# Patient Record
Sex: Male | Born: 1975 | Race: White | Hispanic: No | Marital: Married | Smoking: Never smoker
Health system: Southern US, Community
[De-identification: ages and names within clinical notes are randomized; demographics above are authoritative.]

## PROBLEM LIST (undated history)

## (undated) DIAGNOSIS — C4359 Malignant melanoma of other part of trunk: Secondary | ICD-10-CM

## (undated) DIAGNOSIS — M1990 Unspecified osteoarthritis, unspecified site: Secondary | ICD-10-CM

## (undated) DIAGNOSIS — J302 Other seasonal allergic rhinitis: Secondary | ICD-10-CM

## (undated) DIAGNOSIS — Z87442 Personal history of urinary calculi: Secondary | ICD-10-CM

## (undated) HISTORY — PX: MELANOMA EXCISION: SHX5266

## (undated) HISTORY — PX: INGUINAL HERNIA REPAIR: SUR1180

## (undated) HISTORY — PX: APPENDECTOMY: SHX54

## (undated) HISTORY — PX: URETEROSCOPY: SHX842

## (undated) HISTORY — PX: WISDOM TOOTH EXTRACTION: SHX21

---

## 2006-04-01 ENCOUNTER — Emergency Department (HOSPITAL_COMMUNITY): Admission: EM | Admit: 2006-04-01 | Discharge: 2006-04-01 | Payer: Self-pay | Admitting: Emergency Medicine

## 2006-04-08 ENCOUNTER — Ambulatory Visit: Payer: Self-pay | Admitting: Internal Medicine

## 2006-06-01 ENCOUNTER — Emergency Department (HOSPITAL_COMMUNITY): Admission: EM | Admit: 2006-06-01 | Discharge: 2006-06-01 | Payer: Self-pay | Admitting: Emergency Medicine

## 2006-10-28 ENCOUNTER — Telehealth: Payer: Self-pay | Admitting: Internal Medicine

## 2006-10-28 DIAGNOSIS — K645 Perianal venous thrombosis: Secondary | ICD-10-CM | POA: Insufficient documentation

## 2006-11-11 ENCOUNTER — Encounter: Payer: Self-pay | Admitting: Internal Medicine

## 2007-02-20 ENCOUNTER — Emergency Department (HOSPITAL_COMMUNITY): Admission: EM | Admit: 2007-02-20 | Discharge: 2007-02-20 | Payer: Self-pay | Admitting: Emergency Medicine

## 2007-02-21 ENCOUNTER — Emergency Department (HOSPITAL_COMMUNITY): Admission: EM | Admit: 2007-02-21 | Discharge: 2007-02-21 | Payer: Self-pay | Admitting: Emergency Medicine

## 2007-02-28 ENCOUNTER — Ambulatory Visit (HOSPITAL_BASED_OUTPATIENT_CLINIC_OR_DEPARTMENT_OTHER): Admission: RE | Admit: 2007-02-28 | Discharge: 2007-02-28 | Payer: Self-pay | Admitting: Urology

## 2008-11-15 IMAGING — CR DG ABDOMEN 1V
2 series · 2 of 2 positions shown · non-contrast
Comparison: Abdominal CT 02/21/07.

CLINICAL DATA: Preop examination.  Right ureteral calculus. 
 ABDOMEN - 1 VIEW:

[t abdomen supine (1 of 2)]
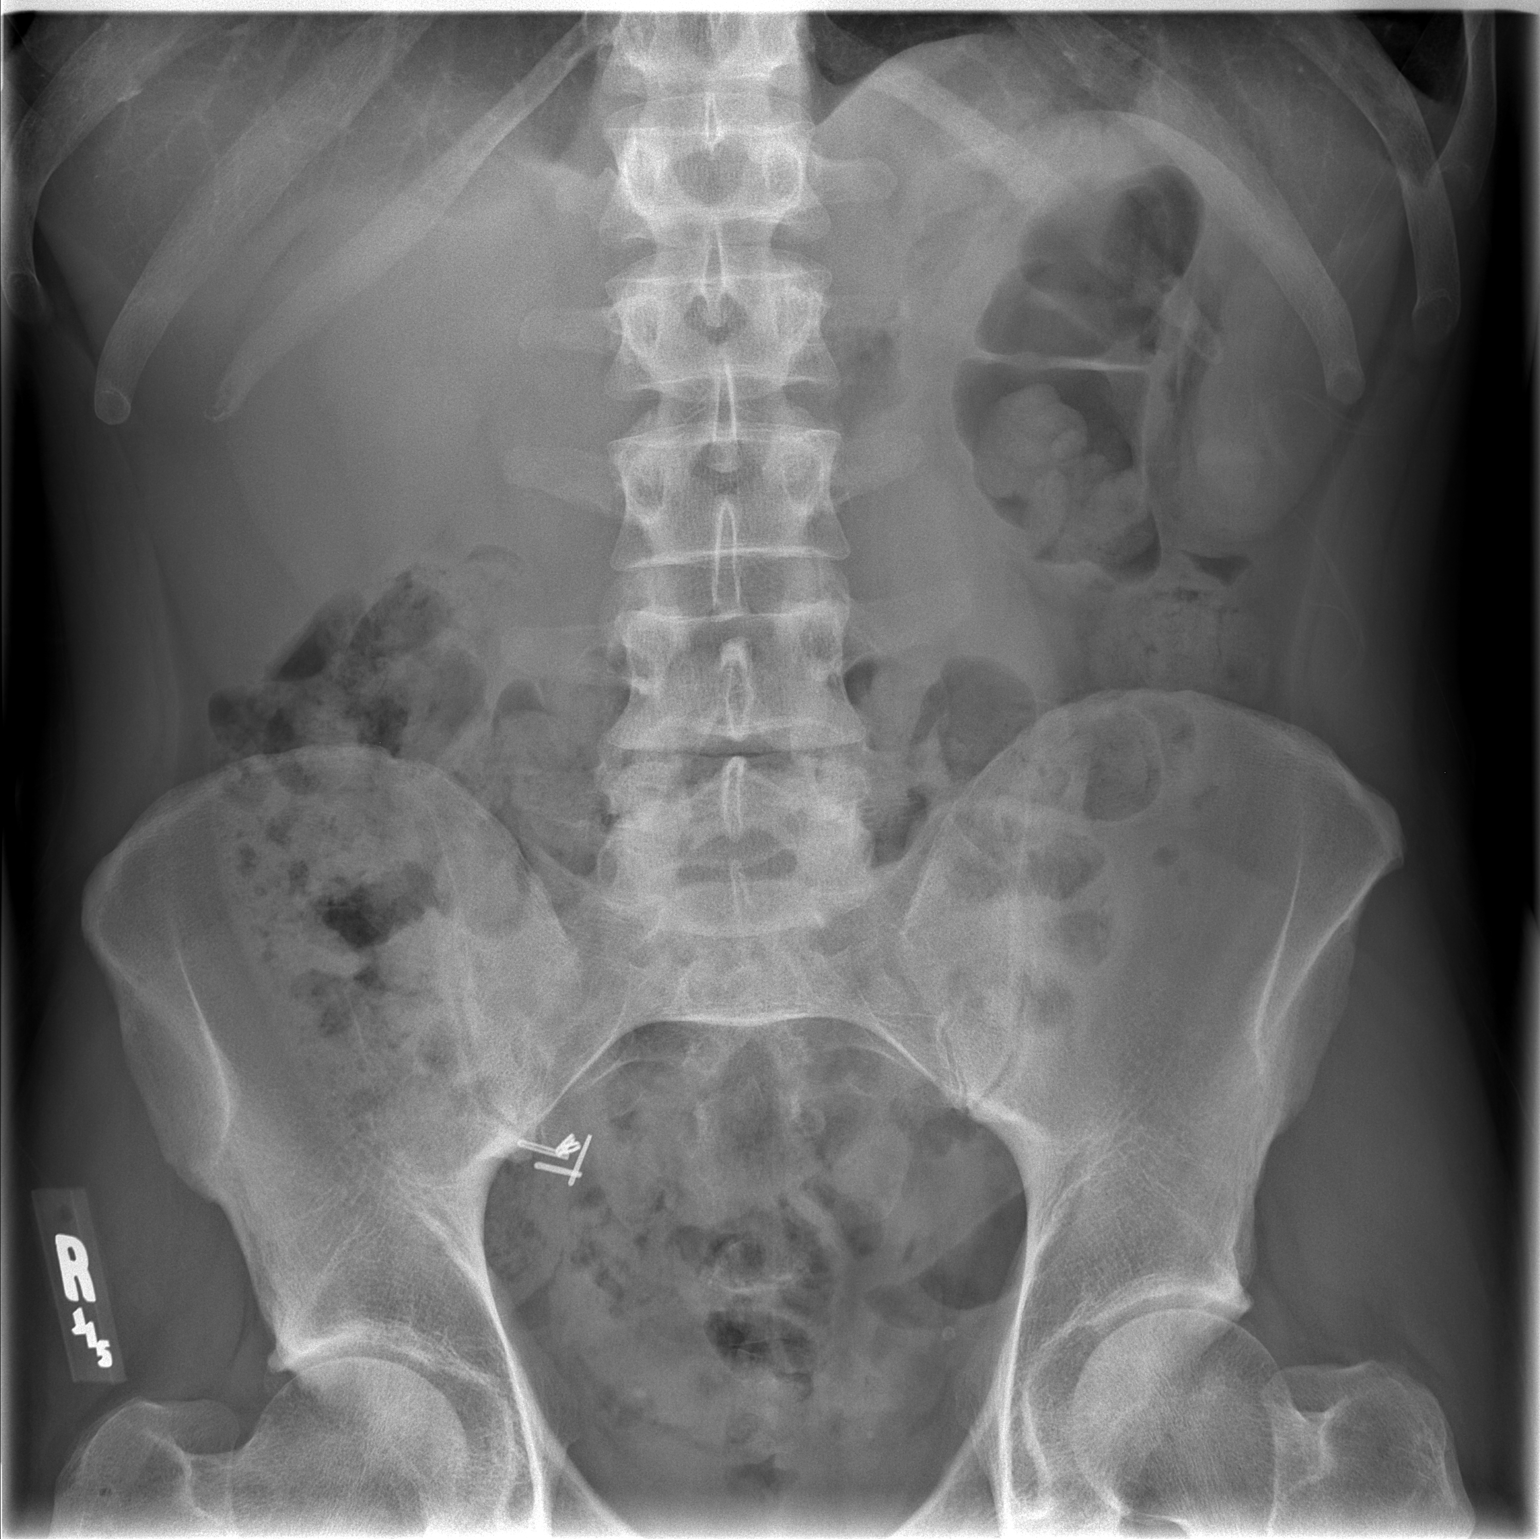

[t abdomen supine (2 of 2)]
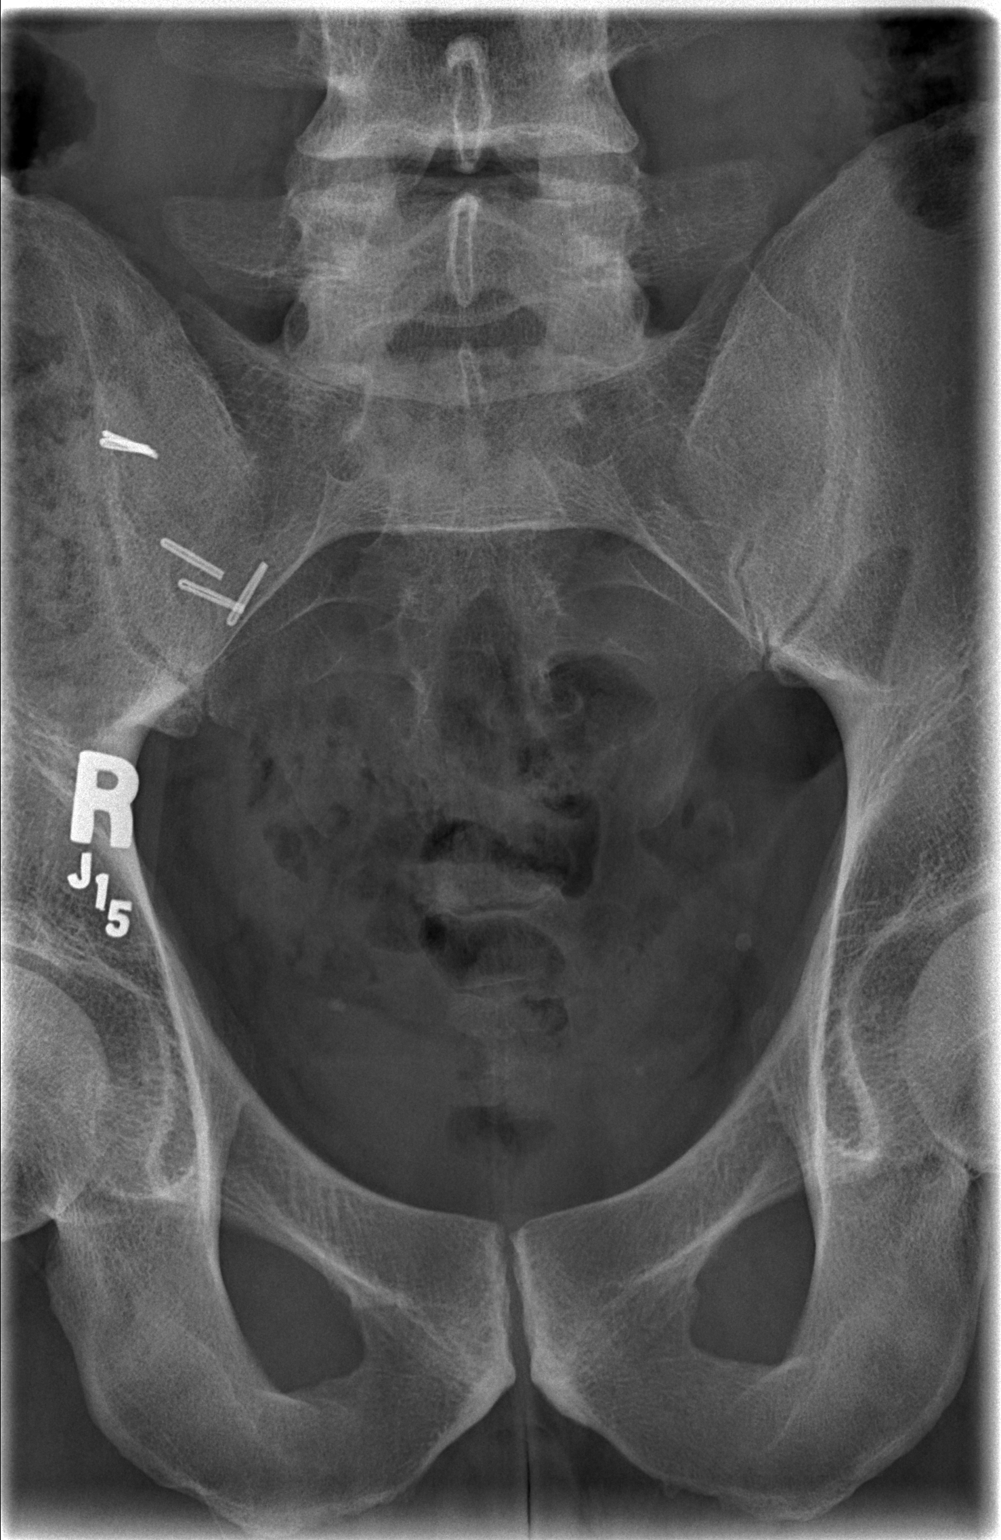

[2 of 2 positions shown; findings below may reference images not displayed]

FINDINGS: 3-4 mm right pelvic calcification is unchanged from the prior CT, corresponding with a distal ureteral calculus.  Left pelvic calcifications are also stable and correspond with phleboliths.  There are surgical clips in the right lower quadrant.  The bowel gas pattern is not obstructive with a moderate amount of stool throughout the colon.
IMPRESSION: No apparent change in the distal right ureteral calculus.

## 2010-08-08 NOTE — Op Note (Signed)
Aaron Mcclain, Aaron Mcclain               ACCOUNT NO.:  [id number]   MEDICAL RECORD NO.:  [id number]          PATIENT TYPE:  AMB   LOCATION:  [facility]                         FACILITY:  [facility]   PHYSICIAN:  Bertram Millard. Dahlstedt, M.D.DATE OF BIRTH:  08/04/1975   DATE OF PROCEDURE:  02/28/2007  DATE OF DISCHARGE:                               OPERATIVE REPORT   PREOPERATIVE DIAGNOSIS:  Right ureteral stone.   POSTOPERATIVE DIAGNOSIS:  Passed ureteral stone.   PRINCIPAL PROCEDURES:  Cystoscopy, right ureteroscopy, removal of  bladder calculi.   SURGEON:  Bertram Millard. Dahlstedt, M.D.   ANESTHESIA:  General with LMA.   COMPLICATIONS:  None.   BRIEF HISTORY:  A 35 year old male who recently presented to me with  significant right flank pain for almost a week.  He requested that we  remove this - this is scheduled for today.  He has had need for  significant pain medicine, and has not been able to work his usual  schedule.  He has had no fever or chills.  He was diagnosed with his  right ureteral stone approximately a week ago.  Alternatives to  extraction of the stone were discussed with the patient as well as risks  and complications involved.  He understands these and desires to  proceed.   DESCRIPTION OF PROCEDURE:  The patient was administered preoperative IV  antibiotics.  He was taken to the operating room where general  anesthetic was administered.  He was placed in the dorsal lithotomy  position.  Genitalia and perineum were prepped and draped.  A 6 French  short ureteroscope was advanced through his urethra into his right  distal ureter.  No stones were seen.  There was some definite narrowing  of this, and I was unable to pass the scope.  I then passed a guidewire  through the scope up into the right renal pelvis.  I then removed the  scope and dilated the distal ureter with a ureteral access sheath.  The  sheath was then removed.  I then repassed the scope.  There was no stone  seen  - I visualized the ureter all the way up to the mid ureter.  At  this point I backed the scope out again.  Again no stone was seen.  I  then looked in the bladder with the scope.  Several small calculi were  seen layering posteriorly in the bladder.  The largest of these,  approximately 3 mm in length, was then removed.  The remaining fragments  were felt small enough to be easily passed.  At this point the bladder  was drained, the guidewire removed and the procedure terminated.  He had  been administered a B & O suppository.  He was awakened and taken to  PACU in stable condition.  He will follow-up in approximately 1 week.  He was discharged on Vicodin p.r.n. and Bactrim DS one p.o. b.i.d. for 3  days.      Bertram Millard. Dahlstedt, M.D.  Electronically Signed    SMD/MEDQ  D:  02/28/2007  T:  02/28/2007  Job:  [id number]

## 2010-08-11 NOTE — Assessment & Plan Note (Signed)
[facility] OFFICE NOTE   WESSON, STITH                        MRN:          [id number]  DATE:04/08/2006                            DOB:          [birth year]    A 35 year old gentleman who was seen today to establish with our  practice.  He has enjoyed excellent health.  He has relocated back to  the [place] from [place] but did go to high school and college in the  area.  He had a remote appendectomy in 1995 and at six months had a  hernia repair.  He was seen at the Urgent Care recently for symptomatic  hemorrhoids.  He is improving.   PAST MEDICAL HISTORY:  Otherwise unremarkable.   FAMILY HISTORY:  Father in his late 67 apparently has history of  diabetes and coronary artery disease status post MI.  Mother, 62, in  good health.  One sister as well.   PHYSICAL EXAMINATION:  GENERAL: Well-developed, fit male in no acute  distress.  VITAL SIGNS: Blood pressure low-normal.  SKIN: Unremarkable.  HEAD, EYES, EARS, NOSE, AND THROAT:  Clear.  NECK:  No adenopathy or thyroid enlargement.  CHEST: Clear.  CARDIOVASCULAR:  Normal heart sounds, no murmurs.  ABDOMEN: Benign.  He has a right lower quadrant surgical scar.  GU:  External genitalia normal.  RECTAL: Exam revealed no external disease.  He did have internal  hemorrhoids that were slightly tender. Stool heme negative.  EXTREMITIES:  Full peripheral pulses, no edema.   IMPRESSION:  Symptomatic internal hemorrhoids.   DISPOSITION:  He is responding to local treatment.  Depending on his  clinical status, will consider referral to a general surgeon.     Gordy Savers, MD  Electronically Signed    PFK/MedQ  DD: 04/08/2006  DT: 04/08/2006  Job #: [id number]

## 2011-01-01 LAB — POCT HEMOGLOBIN-HEMACUE
Hemoglobin: 14.8
Operator id: 268271

## 2011-01-02 LAB — POCT URINALYSIS DIP (DEVICE)
Glucose, UA: NEGATIVE
Ketones, ur: NEGATIVE
Operator id: 126491
Protein, ur: NEGATIVE
Specific Gravity, Urine: <=1.005

## 2011-01-02 LAB — URINALYSIS, ROUTINE W REFLEX MICROSCOPIC
Bilirubin Urine: NEGATIVE
Glucose, UA: NEGATIVE
Urobilinogen, UA: 0.2

## 2018-12-23 ENCOUNTER — Other Ambulatory Visit: Payer: Self-pay

## 2018-12-23 DIAGNOSIS — Z20822 Contact with and (suspected) exposure to covid-19: Secondary | ICD-10-CM

## 2018-12-25 ENCOUNTER — Ambulatory Visit: Payer: Self-pay

## 2018-12-25 LAB — NOVEL CORONAVIRUS, NAA: SARS-CoV-2, NAA: DETECTED — AB

## 2018-12-25 NOTE — Telephone Encounter (Signed)
Incoming call from Patient requesting Covid testing result Provided results voiced understanding.  Provided care advice.

## 2019-04-17 ENCOUNTER — Ambulatory Visit
Admission: EM | Admit: 2019-04-17 | Discharge: 2019-04-17 | Disposition: A | Discharge status: Home / Self Care | Payer: BC Managed Care – PPO | Attending: Emergency Medicine | Admitting: Emergency Medicine

## 2019-04-17 ENCOUNTER — Other Ambulatory Visit: Payer: Self-pay

## 2019-04-17 ENCOUNTER — Encounter: Payer: Self-pay | Admitting: Emergency Medicine

## 2019-04-17 DIAGNOSIS — Z20822 Contact with and (suspected) exposure to covid-19: Secondary | ICD-10-CM | POA: Diagnosis not present

## 2019-04-17 DIAGNOSIS — J069 Acute upper respiratory infection, unspecified: Secondary | ICD-10-CM

## 2019-04-17 NOTE — ED Triage Notes (Signed)
Pt presents to UC w/ c/o chest and nasal congestion, headache, difficulty taking deep breath x3 days. Pt's daughter is covid positive.

## 2019-04-17 NOTE — Discharge Instructions (Addendum)
COVID testing ordered.  It will take between 2-7 days for test results.  Someone will contact you regarding abnormal results.    In the meantime: You should remain isolated in your home for 10 days from symptom onset AND greater than 72 hours after symptoms resolution (absence of fever without the use of fever-reducing medication and improvement in respiratory symptoms), whichever is longer Get plenty of rest and push fluids Use OTC medications like ibuprofen or tylenol as needed fever or pain Follow up with PCP regarding results Call or go to the ED if you have any new or worsening symptoms such as fever, cough, shortness of breath, chest tightness, chest pain, turning blue, changes in mental status, etc..Marland Kitchen

## 2019-04-17 NOTE — ED Provider Notes (Signed)
[place]   [id number] 04/17/19 Arrival Time: 1619   CC: COVID symptoms  SUBJECTIVE: History from: patient.  Aaron Mcclain is a 44 y.o. male who presents with sinus headache, nasal congestion, PND, and mild chest pressure with deep breath x 3 days. Daughters tested positive for COVID.  Denies recent travel.  Takes allegra daily, but has not taken anything specifically for presenting symptoms. Symptoms are made worse with working out, states he feels deconditioned.  Reports previous COVID infection in September of 2020.   Denies fever, chills, fatigue, rhinorrhea, sore throat, SOB, wheezing, chest pain, nausea, vomiting, changes in bowel or bladder habits.     ROS: As per HPI.  All other pertinent ROS negative.     History reviewed. No pertinent past medical history. History reviewed. No pertinent surgical history. No Known Allergies No current facility-administered medications on file prior to encounter.   No current outpatient medications on file prior to encounter.   Social History   Socioeconomic History  . Marital status: Married    Spouse name: Not on file  . Number of children: Not on file  . Years of education: Not on file  . Highest education level: Not on file  Occupational History  . Not on file  Tobacco Use  . Smoking status: Never Smoker  . Smokeless tobacco: Never Used  Substance and Sexual Activity  . Alcohol use: Yes    Comment: occasional  . Drug use: Never  . Sexual activity: Not on file  Other Topics Concern  . Not on file  Social History Narrative  . Not on file   Social Determinants of Health   Financial Resource Strain:   . Difficulty of Paying Living Expenses: Not on file  Food Insecurity:   . Worried About Charity fundraiser in the Last Year: Not on file  . Ran Out of Food in the Last Year: Not on file  Transportation Needs:   . Lack of Transportation (Medical): Not on file  . Lack of Transportation (Non-Medical): Not on file   Physical Activity:   . Days of Exercise per Week: Not on file  . Minutes of Exercise per Session: Not on file  Stress:   . Feeling of Stress : Not on file  Social Connections:   . Frequency of Communication with Friends and Family: Not on file  . Frequency of Social Gatherings with Friends and Family: Not on file  . Attends Religious Services: Not on file  . Active Member of Clubs or Organizations: Not on file  . Attends Archivist Meetings: Not on file  . Marital Status: Not on file  Intimate Partner Violence:   . Fear of Current or Ex-Partner: Not on file  . Emotionally Abused: Not on file  . Physically Abused: Not on file  . Sexually Abused: Not on file   Family History  Problem Relation Age of Onset  . Healthy Mother   . Healthy Father     OBJECTIVE:  Vitals:   04/17/19 1636  BP: 140/80  Pulse: 68  Resp: 17  Temp: 98.2 F (36.8 C)  TempSrc: Oral  SpO2: 98%     General appearance: alert; appears mildly fatigued, but nontoxic; speaking in full sentences and tolerating own secretions HEENT: NCAT; Ears: EACs clear, TMs pearly gray; Eyes: PERRL.  EOM grossly intact. Nose: nares patent without rhinorrhea, Throat: oropharynx clear, tonsils non erythematous or enlarged, uvula midline  Neck: supple without LAD Lungs: unlabored respirations, symmetrical air  entry; cough: absent; no respiratory distress; CTAB Heart: regular rate and rhythm.   Skin: warm and dry Psychological: alert and cooperative; normal mood and affect  ASSESSMENT & PLAN:  1. Suspected COVID-19 virus infection   2. Viral URI    COVID testing ordered.  It will take between 2-7 days for test results.  Someone will contact you regarding abnormal results.    In the meantime: You should remain isolated in your home for 10 days from symptom onset AND greater than 72 hours after symptoms resolution (absence of fever without the use of fever-reducing medication and improvement in respiratory  symptoms), whichever is longer Get plenty of rest and push fluids Use OTC medications like ibuprofen or tylenol as needed fever or pain Follow up with PCP regarding results Call or go to the ED if you have any new or worsening symptoms such as fever, cough, shortness of breath, chest tightness, chest pain, turning blue, changes in mental status, etc...    Reviewed expectations re: course of current medical issues. Questions answered. Outlined signs and symptoms indicating need for more acute intervention. Patient verbalized understanding. After Visit Summary given.         Lestine Box, PA-C 04/17/19 1704

## 2019-04-18 LAB — SPECIMEN STATUS REPORT

## 2019-04-18 LAB — NOVEL CORONAVIRUS, NAA: SARS-CoV-2, NAA: NOT DETECTED

## 2019-12-14 ENCOUNTER — Ambulatory Visit: Payer: Self-pay | Admitting: Surgery

## 2019-12-14 NOTE — Patient Instructions (Addendum)
DUE TO COVID-19 ONLY ONE VISITOR IS ALLOWED TO COME WITH YOU AND STAY IN THE WAITING ROOM ONLY  DURING PRE OP AND PROCEDURE.   IF YOU WILL BE ADMITTED INTO THE HOSPITAL YOU ARE ALLOWED ONE SUPPORT PERSON DURING  VISITATION HOURS ONLY (10AM -8PM)   . The support person may change daily. . The support person must pass our screening, gel in and out, and wear a mask at all times, including in the patient's room. . Patients must also wear a mask when staff or their support person are in the room.   COVID SWAB TESTING MUST BE COMPLETED ON:  Friday, 12-18-19 @ 9:35 AM    [address] [place], [place] [address]  (Must self quarantine after testing. Follow instructions on handout.)        Your procedure is scheduled on:  Tuesday, 12-22-19   Report to [facility] Main  Entrance   Report to admitting at 10:45 AM   Call this number if you have problems the morning of surgery [phone]   Do not eat food :After Midnight.   May have liquids until  9:45 AM  day of surgery  CLEAR LIQUID DIET  Foods Allowed                                                                     Foods Excluded  Water, Black Coffee and tea, regular and decaf             liquids that you cannot  Plain Jell-O in any flavor  (No red)                                   see through such as: Fruit ices (not with fruit pulp)                                      milk, soups, orange juice              Iced Popsicles (No red)                                      All solid food                                   Apple juices Sports drinks like Gatorade (No red) Lightly seasoned clear broth or consume(fat free)  Sugar, honey syrup     Oral Hygiene is also important to reduce your risk of infection.                                    Remember - BRUSH YOUR TEETH THE MORNING OF SURGERY WITH YOUR REGULAR TOOTHPASTE   Do NOT smoke after Midnight   Take these medicines the morning of surgery with A SIP OF WATER: None  You may not have any metal on your body including jewelry, and body piercings             Do not wear lotions, powders, perfumes/cologne, or deodorant             Men may shave face and neck.   Do not bring valuables to the hospital. [place].   Contacts, dentures or bridgework may not be worn into surgery.   Bring small overnight bag day of surgery.    Patients discharged the day of surgery will not be allowed to drive home.                Please read over the following fact sheets you were given: IF YOU HAVE QUESTIONS ABOUT YOUR PRE OP INSTRUCTIONS PLEASE CALL [phone]   [facility] - Preparing for Surgery Before surgery, you can play an important role.  Because skin is not sterile, your skin needs to be as free of germs as possible.  You can reduce the number of germs on your skin by washing with CHG (chlorahexidine gluconate) soap before surgery.  CHG is an antiseptic cleaner which kills germs and bonds with the skin to continue killing germs even after washing. Please DO NOT use if you have an allergy to CHG or antibacterial soaps.  If your skin becomes reddened/irritated stop using the CHG and inform your nurse when you arrive at Short Stay. Do not shave (including legs and underarms) for at least 48 hours prior to the first CHG shower.  You may shave your face/neck.  Please follow these instructions carefully:  1.  Shower with CHG Soap the night before surgery and the  morning of surgery.  2.  If you choose to wash your hair, wash your hair first as usual with your normal  shampoo.  3.  After you shampoo, rinse your hair and body thoroughly to remove the shampoo.                             4.  Use CHG as you would any other liquid soap.  You can apply chg directly to the skin and wash.  Gently with a scrungie or clean washcloth.  5.  Apply the CHG Soap to your body ONLY FROM THE NECK DOWN.   Do   not use on  face/ open                           Wound or open sores. Avoid contact with eyes, ears mouth and   genitals (private parts).                       Wash face,  Genitals (private parts) with your normal soap.             6.  Wash thoroughly, paying special attention to the area where your    surgery  will be performed.  7.  Thoroughly rinse your body with warm water from the neck down.  8.  DO NOT shower/wash with your normal soap after using and rinsing off the CHG Soap.                9.  Pat yourself dry with a clean towel.            10.  Wear clean pajamas.  11.  Place clean sheets on your bed the night of your first shower and do not  sleep with pets. Day of Surgery : Do not apply any lotions/deodorants the morning of surgery.  Please wear clean clothes to the hospital/surgery center.  FAILURE TO FOLLOW THESE INSTRUCTIONS MAY RESULT IN THE CANCELLATION OF YOUR SURGERY  PATIENT SIGNATURE_________________________________  NURSE SIGNATURE__________________________________  ________________________________________________________________________

## 2019-12-14 NOTE — Progress Notes (Addendum)
COVID Vaccine Completed: x1 Date COVID Vaccine completed: 07-06-19 COVID vaccine manufacturer: Kelly   PCP - Anastasia Pall, MD Cardiologist -   Chest x-ray -  EKG -  Stress Test -  ECHO -  Cardiac Cath -  Pacemaker/ICD device last checked:  Sleep Study -  CPAP -   Fasting Blood Sugar -  Checks Blood Sugar _____ times a day  Blood Thinner Instructions: Aspirin Instructions: Last Dose:  Anesthesia review:   Patient denies shortness of breath, fever, cough and chest pain at PAT appointment   Patient verbalized understanding of instructions that were given to them at the PAT appointment. Patient was also instructed that they will need to review over the PAT instructions again at home before surgery.

## 2019-12-14 NOTE — H&P (Signed)
Aaron Mcclain DOB: [birth year] Married / Language: English / Race: White Male   History of Present Illness Patient words: Healthy and very active 44 year old man who presented in June with a left inguinal hernia. He noticed this about a month and a half ago. It is currently asymptomatic, aside from brief pain whenever he sneezes or vasalvas significantly. He has been taking it easy with his workouts and has cut back on leg work and squats etc. He reports a history of right inguinal hernia repair when he was about 59-months-old, but no other abdominal surgery. 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 Nonsmoker.   Allergies  No Known Drug Allergies    Medication History  No Current Medications Medications Reconciled  Other Problems Arthritis     Review of Systems Musculoskeletal Present- Back Pain. Not Present- Joint Pain, Joint Stiffness, Muscle Pain, Muscle Weakness and Swelling of Extremities. Neurological Not Present- Decreased Memory, Fainting, Headaches, Numbness, Seizures, Tingling, Tremor, Trouble walking and Weakness. All other systems negative  Vitals  Weight: 211.25 lb Height: 78in Body Surface Area: 2.31 m Body Mass Index: 24.41 kg/m  Temp.: 97.11F  Pulse: 77 (Regular)  BP: 120/76(Sitting, Left Arm, Standard)  Physical Exam  Alert and well-appearing Unlabored respirations Abdomen is soft and nontender. There is a reducible left inguinal hernia.    Assessment & Plan LEFT INGUINAL HERNIA (K40.90) Story: We discussed the relevant anatomy and pathology. Discussed options for repair including open and laparoscopic/robotic. Discussed the techniques as well as risks of bleeding, infection, pain, scarring, injury to intra-abdominal or intracranial structures including bowel, bladder, vessels, nerves, chronic pain, hernia recurrence, and systemic risk from general anesthesia. Questions were welcomed and  answered. Given his activity level I think a laparoscopic approach would have a slight advantage, but did discuss that typically for unilateral non-recurrent hernia an open approach is warranted. He would like to consider surgery and he will call when he is ready to schedule

## 2019-12-14 NOTE — H&P (View-Only) (Signed)
Aaron Mcclain DOB: Jun 14, 1975 Married / Language: English / Race: White Male   History of Present Illness Patient words: Healthy and very active 44 year old man who presented in June with a left inguinal hernia. He noticed this about a month and a half ago. It is currently asymptomatic, aside from brief pain whenever he sneezes or vasalvas significantly. He has been taking it easy with his workouts and has cut back on leg work and squats etc. He reports a history of right inguinal hernia repair when he was about 97-months-old, but no other abdominal surgery. 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 Nonsmoker.   Allergies  No Known Drug Allergies    Medication History  No Current Medications Medications Reconciled  Other Problems Arthritis     Review of Systems Musculoskeletal Present- Back Pain. Not Present- Joint Pain, Joint Stiffness, Muscle Pain, Muscle Weakness and Swelling of Extremities. Neurological Not Present- Decreased Memory, Fainting, Headaches, Numbness, Seizures, Tingling, Tremor, Trouble walking and Weakness. All other systems negative  Vitals  Weight: 211.25 lb Height: 78in Body Surface Area: 2.31 m Body Mass Index: 24.41 kg/m  Temp.: 97.67F  Pulse: 77 (Regular)  BP: 120/76(Sitting, Left Arm, Standard)  Physical Exam  Alert and well-appearing Unlabored respirations Abdomen is soft and nontender. There is a reducible left inguinal hernia.    Assessment & Plan LEFT INGUINAL HERNIA (K40.90) Story: We discussed the relevant anatomy and pathology. Discussed options for repair including open and laparoscopic/robotic. Discussed the techniques as well as risks of bleeding, infection, pain, scarring, injury to intra-abdominal or intracranial structures including bowel, bladder, vessels, nerves, chronic pain, hernia recurrence, and systemic risk from general anesthesia. Questions were welcomed and  answered. Given his activity level I think a laparoscopic approach would have a slight advantage, but did discuss that typically for unilateral non-recurrent hernia an open approach is warranted. He would like to consider surgery and he will call when he is ready to schedule

## 2019-12-16 ENCOUNTER — Encounter (HOSPITAL_COMMUNITY)
Admission: RE | Admit: 2019-12-16 | Discharge: 2019-12-16 | Disposition: A | Discharge status: Home / Self Care | Payer: BC Managed Care – PPO | Source: Ambulatory Visit | Attending: Surgery | Admitting: Surgery

## 2019-12-16 ENCOUNTER — Other Ambulatory Visit: Payer: Self-pay

## 2019-12-16 ENCOUNTER — Encounter (HOSPITAL_COMMUNITY): Payer: Self-pay

## 2019-12-16 DIAGNOSIS — Z01812 Encounter for preprocedural laboratory examination: Secondary | ICD-10-CM | POA: Diagnosis not present

## 2019-12-16 HISTORY — DX: Unspecified osteoarthritis, unspecified site: M19.90

## 2019-12-16 HISTORY — DX: Other seasonal allergic rhinitis: J30.2

## 2019-12-16 HISTORY — DX: Personal history of urinary calculi: Z87.442

## 2019-12-16 HISTORY — DX: Malignant melanoma of other part of trunk: C43.59

## 2019-12-16 LAB — CBC
HCT: 41.1 % (ref 39.0–52.0)
Hemoglobin: 13.8 g/dL (ref 13.0–17.0)
MCH: 31.9 pg (ref 26.0–34.0)
MCHC: 33.6 g/dL (ref 30.0–36.0)
MCV: 94.9 fL (ref 80.0–100.0)
Platelets: 194 10*3/uL (ref 150–400)
RBC: 4.33 MIL/uL (ref 4.22–5.81)
RDW: 11.3 % — ABNORMAL LOW (ref 11.5–15.5)
WBC: 7.3 10*3/uL (ref 4.0–10.5)
nRBC: 0 % (ref 0.0–0.2)

## 2019-12-18 ENCOUNTER — Other Ambulatory Visit (HOSPITAL_COMMUNITY)
Admission: RE | Admit: 2019-12-18 | Discharge: 2019-12-18 | Disposition: A | Discharge status: Home / Self Care | Payer: BC Managed Care – PPO | Source: Ambulatory Visit | Attending: Surgery | Admitting: Surgery

## 2019-12-18 DIAGNOSIS — Z01812 Encounter for preprocedural laboratory examination: Secondary | ICD-10-CM | POA: Diagnosis present

## 2019-12-18 DIAGNOSIS — Z20822 Contact with and (suspected) exposure to covid-19: Secondary | ICD-10-CM | POA: Insufficient documentation

## 2019-12-18 LAB — SARS CORONAVIRUS 2 (TAT 6-24 HRS): SARS Coronavirus 2: NEGATIVE

## 2019-12-21 ENCOUNTER — Encounter (HOSPITAL_COMMUNITY): Payer: Self-pay | Admitting: Surgery

## 2019-12-21 MED ORDER — BUPIVACAINE LIPOSOME 1.3 % IJ SUSP
20.0000 mL | Freq: Once | INTRAMUSCULAR | Status: DC
  Filled 2019-12-21: qty 20

## 2019-12-22 ENCOUNTER — Other Ambulatory Visit: Payer: Self-pay

## 2019-12-22 ENCOUNTER — Ambulatory Visit (HOSPITAL_COMMUNITY)
Admission: RE | Admit: 2019-12-22 | Discharge: 2019-12-22 | Disposition: A | Discharge status: Home / Self Care | Payer: BC Managed Care – PPO | Attending: Surgery | Admitting: Surgery

## 2019-12-22 ENCOUNTER — Ambulatory Visit (HOSPITAL_COMMUNITY): Payer: BC Managed Care – PPO | Admitting: Anesthesiology

## 2019-12-22 ENCOUNTER — Encounter (HOSPITAL_COMMUNITY)
Admission: RE | Disposition: A | Discharge status: Home / Self Care | Payer: Self-pay | Source: Home / Self Care | Attending: Surgery

## 2019-12-22 ENCOUNTER — Encounter (HOSPITAL_COMMUNITY): Payer: Self-pay | Admitting: Surgery

## 2019-12-22 DIAGNOSIS — M1990 Unspecified osteoarthritis, unspecified site: Secondary | ICD-10-CM | POA: Insufficient documentation

## 2019-12-22 DIAGNOSIS — K4090 Unilateral inguinal hernia, without obstruction or gangrene, not specified as recurrent: Secondary | ICD-10-CM | POA: Diagnosis present

## 2019-12-22 HISTORY — PX: INGUINAL HERNIA REPAIR: SHX194

## 2019-12-22 SURGERY — REPAIR, HERNIA, INGUINAL, ADULT
Anesthesia: General | Site: Abdomen | Laterality: Left

## 2019-12-22 MED ORDER — CEFAZOLIN SODIUM-DEXTROSE 2-4 GM/100ML-% IV SOLN
2.0000 g | INTRAVENOUS | Status: AC
  Administered 2019-12-22: 2 g via INTRAVENOUS
  Filled 2019-12-22: qty 100

## 2019-12-22 MED ORDER — FENTANYL CITRATE (PF) 100 MCG/2ML IJ SOLN: 25.0000 ug | INTRAMUSCULAR | Status: DC | PRN

## 2019-12-22 MED ORDER — PROPOFOL 500 MG/50ML IV EMUL
INTRAVENOUS | Status: AC
  Filled 2019-12-22: qty 50

## 2019-12-22 MED ORDER — SODIUM CHLORIDE 0.9% FLUSH: 3.0000 mL | Freq: Two times a day (BID) | INTRAVENOUS | Status: DC

## 2019-12-22 MED ORDER — MIDAZOLAM HCL 2 MG/2ML IJ SOLN
INTRAMUSCULAR | Status: AC
  Filled 2019-12-22: qty 2

## 2019-12-22 MED ORDER — BUPIVACAINE-EPINEPHRINE 0.25% -1:200000 IJ SOLN
INTRAMUSCULAR | Status: DC | PRN
  Administered 2019-12-22: 30 mL

## 2019-12-22 MED ORDER — GABAPENTIN 300 MG PO CAPS
300.0000 mg | ORAL_CAPSULE | ORAL | Status: AC
  Administered 2019-12-22: 300 mg via ORAL
  Filled 2019-12-22: qty 1

## 2019-12-22 MED ORDER — ONDANSETRON HCL 4 MG/2ML IJ SOLN
INTRAMUSCULAR | Status: AC
  Filled 2019-12-22: qty 2

## 2019-12-22 MED ORDER — DEXAMETHASONE SODIUM PHOSPHATE 4 MG/ML IJ SOLN
INTRAMUSCULAR | Status: DC | PRN
  Administered 2019-12-22: 9 mg via INTRAVENOUS

## 2019-12-22 MED ORDER — BUPIVACAINE LIPOSOME 1.3 % IJ SUSP
INTRAMUSCULAR | Status: DC | PRN
  Administered 2019-12-22: 20 mL

## 2019-12-22 MED ORDER — CHLORHEXIDINE GLUCONATE 4 % EX LIQD: 60.0000 mL | Freq: Once | CUTANEOUS | Status: DC

## 2019-12-22 MED ORDER — ONDANSETRON 4 MG PO TBDP: 4.0000 mg | ORAL_TABLET | Freq: Three times a day (TID) | ORAL | 0 refills | Status: AC | PRN

## 2019-12-22 MED ORDER — ACETAMINOPHEN 500 MG PO TABS
1000.0000 mg | ORAL_TABLET | ORAL | Status: AC
  Administered 2019-12-22: 1000 mg via ORAL
  Filled 2019-12-22: qty 2

## 2019-12-22 MED ORDER — LIDOCAINE HCL (CARDIAC) PF 100 MG/5ML IV SOSY
PREFILLED_SYRINGE | INTRAVENOUS | Status: DC | PRN
  Administered 2019-12-22: 50 mg via INTRAVENOUS

## 2019-12-22 MED ORDER — ORAL CARE MOUTH RINSE: 15.0000 mL | Freq: Once | OROMUCOSAL | Status: AC

## 2019-12-22 MED ORDER — EPHEDRINE SULFATE-NACL 50-0.9 MG/10ML-% IV SOSY
PREFILLED_SYRINGE | INTRAVENOUS | Status: DC | PRN
  Administered 2019-12-22: 10 mg via INTRAVENOUS

## 2019-12-22 MED ORDER — PROPOFOL 10 MG/ML IV BOLUS
INTRAVENOUS | Status: DC | PRN
  Administered 2019-12-22 (×2): 200 mg via INTRAVENOUS

## 2019-12-22 MED ORDER — SODIUM CHLORIDE 0.9 % IV SOLN: 250.0000 mL | INTRAVENOUS | Status: DC | PRN

## 2019-12-22 MED ORDER — OXYCODONE HCL 5 MG PO TABS: 5.0000 mg | ORAL_TABLET | Freq: Three times a day (TID) | ORAL | 0 refills | Status: AC | PRN

## 2019-12-22 MED ORDER — LACTATED RINGERS IV SOLN: INTRAVENOUS | Status: DC

## 2019-12-22 MED ORDER — AMISULPRIDE (ANTIEMETIC) 5 MG/2ML IV SOLN
INTRAVENOUS | Status: AC
  Filled 2019-12-22: qty 2

## 2019-12-22 MED ORDER — AMISULPRIDE (ANTIEMETIC) 5 MG/2ML IV SOLN
10.0000 mg | Freq: Once | INTRAVENOUS | Status: AC
  Administered 2019-12-22: 10 mg via INTRAVENOUS

## 2019-12-22 MED ORDER — ONDANSETRON HCL 4 MG/2ML IJ SOLN
4.0000 mg | Freq: Once | INTRAMUSCULAR | Status: AC
  Administered 2019-12-22: 4 mg via INTRAVENOUS

## 2019-12-22 MED ORDER — KETOROLAC TROMETHAMINE 30 MG/ML IJ SOLN
30.0000 mg | Freq: Once | INTRAMUSCULAR | Status: AC | PRN
  Administered 2019-12-22: 30 mg via INTRAVENOUS

## 2019-12-22 MED ORDER — ACETAMINOPHEN 650 MG RE SUPP
650.0000 mg | RECTAL | Status: DC | PRN
  Filled 2019-12-22: qty 1

## 2019-12-22 MED ORDER — FENTANYL CITRATE (PF) 250 MCG/5ML IJ SOLN
INTRAMUSCULAR | Status: DC | PRN
  Administered 2019-12-22: 100 ug via INTRAVENOUS
  Administered 2019-12-22: 50 ug via INTRAVENOUS

## 2019-12-22 MED ORDER — KETAMINE HCL 10 MG/ML IJ SOLN
INTRAMUSCULAR | Status: DC | PRN
  Administered 2019-12-22: 30 mg via INTRAVENOUS

## 2019-12-22 MED ORDER — FENTANYL CITRATE (PF) 250 MCG/5ML IJ SOLN
INTRAMUSCULAR | Status: AC
Start: 2019-12-22 — End: ?
  Filled 2019-12-22: qty 5

## 2019-12-22 MED ORDER — MIDAZOLAM HCL 5 MG/5ML IJ SOLN
INTRAMUSCULAR | Status: DC | PRN
  Administered 2019-12-22: 2 mg via INTRAVENOUS

## 2019-12-22 MED ORDER — BUPIVACAINE-EPINEPHRINE (PF) 0.25% -1:200000 IJ SOLN
INTRAMUSCULAR | Status: AC
  Filled 2019-12-22: qty 30

## 2019-12-22 MED ORDER — OXYCODONE HCL 5 MG PO TABS: 5.0000 mg | ORAL_TABLET | ORAL | Status: DC | PRN

## 2019-12-22 MED ORDER — ONDANSETRON HCL 4 MG/2ML IJ SOLN
INTRAMUSCULAR | Status: DC | PRN
  Administered 2019-12-22: 4 mg via INTRAVENOUS

## 2019-12-22 MED ORDER — ACETAMINOPHEN 325 MG PO TABS: 650.0000 mg | ORAL_TABLET | ORAL | Status: DC | PRN

## 2019-12-22 MED ORDER — SODIUM CHLORIDE 0.9% FLUSH: 3.0000 mL | INTRAVENOUS | Status: DC | PRN

## 2019-12-22 MED ORDER — 0.9 % SODIUM CHLORIDE (POUR BTL) OPTIME
TOPICAL | Status: DC | PRN
  Administered 2019-12-22: 1000 mL

## 2019-12-22 MED ORDER — KETOROLAC TROMETHAMINE 30 MG/ML IJ SOLN
INTRAMUSCULAR | Status: AC
  Filled 2019-12-22: qty 1

## 2019-12-22 MED ORDER — CHLORHEXIDINE GLUCONATE 0.12 % MT SOLN
15.0000 mL | Freq: Once | OROMUCOSAL | Status: AC
  Administered 2019-12-22: 15 mL via OROMUCOSAL

## 2019-12-22 MED ORDER — DOCUSATE SODIUM 100 MG PO CAPS: 100.0000 mg | ORAL_CAPSULE | Freq: Two times a day (BID) | ORAL | 0 refills | Status: AC

## 2019-12-22 MED ORDER — CHLORHEXIDINE GLUCONATE 4 % EX LIQD
60.0000 mL | Freq: Once | CUTANEOUS | Status: AC
  Administered 2019-12-22: 4 via TOPICAL

## 2019-12-22 SURGICAL SUPPLY — 42 items
APL PRP STRL LF DISP 70% ISPRP (MISCELLANEOUS) ×1
APL SKNCLS STERI-STRIP NONHPOA (GAUZE/BANDAGES/DRESSINGS) ×1
BENZOIN TINCTURE PRP APPL 2/3 (GAUZE/BANDAGES/DRESSINGS) ×3 IMPLANT
BLADE SURG 15 STRL LF DISP TIS (BLADE) ×1 IMPLANT
BLADE SURG 15 STRL SS (BLADE) ×3
CHLORAPREP W/TINT 26 (MISCELLANEOUS) ×3 IMPLANT
CLOSURE WOUND 1/2 X4 (GAUZE/BANDAGES/DRESSINGS) ×1
COVER SURGICAL LIGHT HANDLE (MISCELLANEOUS) ×3 IMPLANT
COVER WAND RF STERILE (DRAPES) IMPLANT
DECANTER SPIKE VIAL GLASS SM (MISCELLANEOUS) ×3 IMPLANT
DRAIN PENROSE 0.5X18 (DRAIN) ×3 IMPLANT
DRAPE LAPAROSCOPIC ABDOMINAL (DRAPES) ×3 IMPLANT
DRSG TEGADERM 4X4.75 (GAUZE/BANDAGES/DRESSINGS) ×3 IMPLANT
ELECT REM PT RETURN 15FT ADLT (MISCELLANEOUS) ×3 IMPLANT
GAUZE SPONGE 4X4 12PLY STRL (GAUZE/BANDAGES/DRESSINGS) ×3 IMPLANT
GLOVE BIO SURGEON STRL SZ 6 (GLOVE) ×3 IMPLANT
GLOVE INDICATOR 6.5 STRL GRN (GLOVE) ×3 IMPLANT
GOWN STRL REUS W/TWL LRG LVL3 (GOWN DISPOSABLE) ×3 IMPLANT
GOWN STRL REUS W/TWL XL LVL3 (GOWN DISPOSABLE) ×3 IMPLANT
KIT BASIN OR (CUSTOM PROCEDURE TRAY) ×3 IMPLANT
KIT TURNOVER KIT A (KITS) IMPLANT
MESH ULTRAPRO 3X6 7.6X15CM (Mesh General) ×3 IMPLANT
NEEDLE HYPO 22GX1.5 SAFETY (NEEDLE) ×3 IMPLANT
PACK BASIC VI WITH GOWN DISP (CUSTOM PROCEDURE TRAY) ×3 IMPLANT
PENCIL SMOKE EVACUATOR (MISCELLANEOUS) IMPLANT
SPONGE LAP 4X18 RFD (DISPOSABLE) ×3 IMPLANT
STRIP CLOSURE SKIN 1/2X4 (GAUZE/BANDAGES/DRESSINGS) ×2 IMPLANT
SUT ETHIBOND 0 MO6 C/R (SUTURE) ×3 IMPLANT
SUT MNCRL AB 4-0 PS2 18 (SUTURE) ×3 IMPLANT
SUT PDS AB 0 CT1 36 (SUTURE) ×6 IMPLANT
SUT SILK 3 0 (SUTURE) ×3
SUT SILK 3-0 18XBRD TIE 12 (SUTURE) ×1 IMPLANT
SUT VIC AB 3-0 SH 27 (SUTURE) ×6
SUT VIC AB 3-0 SH 27XBRD (SUTURE) ×2 IMPLANT
SUT VICRYL 0 UR6 27IN ABS (SUTURE) IMPLANT
SUT VICRYL 3 0 BR 18  UND (SUTURE) ×3
SUT VICRYL 3 0 BR 18 UND (SUTURE) ×1 IMPLANT
SYR CONTROL 10ML LL (SYRINGE) ×3 IMPLANT
TOWEL OR 17X26 10 PK STRL BLUE (TOWEL DISPOSABLE) ×3 IMPLANT
TOWEL OR NON WOVEN STRL DISP B (DISPOSABLE) ×3 IMPLANT
TRAY FOLEY MTR SLVR 16FR STAT (SET/KITS/TRAYS/PACK) ×3 IMPLANT
YANKAUER SUCT BULB TIP 10FT TU (MISCELLANEOUS) IMPLANT

## 2019-12-22 NOTE — Anesthesia Postprocedure Evaluation (Signed)
Anesthesia Post Note  Patient: Aaron Mcclain  Procedure(s) Performed: OPEN LEFT INGUINAL HERNIA REPAIR WITH MESH (Left Abdomen)     Patient location during evaluation: PACU Anesthesia Type: General Level of consciousness: awake and alert Pain management: pain level controlled Vital Signs Assessment: post-procedure vital signs reviewed and stable Respiratory status: spontaneous breathing, nonlabored ventilation, respiratory function stable and patient connected to nasal cannula oxygen Cardiovascular status: blood pressure returned to baseline and stable Postop Assessment: no apparent nausea or vomiting Anesthetic complications: no   No complications documented.  Last Vitals:  Vitals:   12/22/19 1600 12/22/19 1620  BP: 128/66 (!) 145/81  Pulse: 77 81  Resp: 15   Temp: 36.6 C   SpO2: 96% 100%    Last Pain:  Vitals:   12/22/19 1620  TempSrc:   PainSc: 0-No pain                 Effie Berkshire

## 2019-12-22 NOTE — Anesthesia Preprocedure Evaluation (Signed)
Anesthesia Evaluation  Patient identified by MRN, date of birth, ID band Patient awake    Reviewed: Allergy & Precautions, NPO status , Patient's Chart, lab work & pertinent test results  Airway Mallampati: II  TM Distance: >3 FB Neck ROM: Full    Dental no notable dental hx.    Pulmonary neg pulmonary ROS,    Pulmonary exam normal breath sounds clear to auscultation       Cardiovascular negative cardio ROS Normal cardiovascular exam Rhythm:Regular Rate:Normal     Neuro/Psych negative neurological ROS  negative psych ROS   GI/Hepatic negative GI ROS, Neg liver ROS,   Endo/Other  negative endocrine ROS  Renal/GU negative Renal ROS  negative genitourinary   Musculoskeletal negative musculoskeletal ROS (+)   Abdominal   Peds negative pediatric ROS (+)  Hematology negative hematology ROS (+)   Anesthesia Other Findings   Reproductive/Obstetrics negative OB ROS                             Anesthesia Physical Anesthesia Plan  ASA: I  Anesthesia Plan: General   Post-op Pain Management:    Induction: Intravenous  PONV Risk Score and Plan: 2 and Ondansetron, Dexamethasone and Treatment may vary due to age or medical condition  Airway Management Planned: LMA  Additional Equipment:   Intra-op Plan:   Post-operative Plan: Extubation in OR  Informed Consent: I have reviewed the patients History and Physical, chart, labs and discussed the procedure including the risks, benefits and alternatives for the proposed anesthesia with the patient or authorized representative who has indicated his/her understanding and acceptance.     Dental advisory given  Plan Discussed with: CRNA and Surgeon  Anesthesia Plan Comments:         Anesthesia Quick Evaluation

## 2019-12-22 NOTE — Discharge Instructions (Signed)
HERNIA REPAIR: POST OP INSTRUCTIONS   EAT Gradually transition to a high fiber diet with a fiber supplement over the next few weeks after discharge.  Start with a pureed / full liquid diet (see below)  WALK Walk an hour a day (cumulative- not all at once).  Control your pain to do that.    CONTROL PAIN Control pain so that you can walk, sleep, tolerate sneezing/coughing, and go up/down stairs.  HAVE A BOWEL MOVEMENT DAILY Keep your bowels regular to avoid problems.  OK to try a laxative to override constipation.  OK to use an antidairrheal to slow down diarrhea.  Call if not better after 2 tries  CALL IF YOU HAVE PROBLEMS/CONCERNS Call if you are still struggling despite following these instructions. Call if you have concerns not answered by these instructions  ######################################################################    1. DIET: Follow a light bland diet & liquids the first 24 hours after arrival home, such as soup, liquids, starches, etc.  Be sure to drink plenty of fluids.  Quickly advance to a usual solid diet within a few days.  Avoid fast food or heavy meals as your are more likely to get nauseated or have irregular bowels.  A low-sugar, high-fiber diet for the rest of your life is ideal.   2. Take your usually prescribed home medications unless otherwise directed.  3. PAIN CONTROL: a. Pain is best controlled by a usual combination of three different methods TOGETHER: i. Ice/Heat ii. Over the counter pain medication iii. Prescription pain medication b. Most patients will experience some swelling and bruising around the hernia(s) such as the bellybutton, groins, or old incisions.  Ice packs or heating pads (30-60 minutes up to 6 times a day) will help. Use ice for the first few days to help decrease swelling and bruising, then switch to heat to help relax tight/sore spots and speed recovery.  Some people prefer to use ice alone, heat alone, alternating between ice &  heat.  Experiment to what works for you.  Swelling and bruising can take several weeks to resolve.   c. It is helpful to take an over-the-counter pain medication regularly for the first days: i. Naproxen (Aleve, etc)  Two 220mg tabs twice a day OR Ibuprofen (Advil, etc) Three 200mg tabs four times a day (every meal & bedtime) AND ii. Acetaminophen (Tylenol, etc) 325-650mg four times a day (every meal & bedtime) d. A  prescription for pain medication should be given to you upon discharge.  Take your pain medication as prescribed, IF NEEDED.  i. If you are having problems/concerns with the prescription medicine (does not control pain, nausea, vomiting, rash, itching, etc), please call us [phone] to see if we need to switch you to a different pain medicine that will work better for you and/or control your side effect better. ii. If you need a refill on your pain medication, please contact your pharmacy.  They will contact our office to request authorization. Prescriptions will not be filled after 5 pm or on week-ends.  4. Avoid getting constipated.  Between the surgery and the pain medications, it is common to experience some constipation.  Increasing fluid intake and taking a fiber supplement (such as Metamucil, Citrucel, FiberCon, MiraLax, etc) 1-2 times a day regularly will usually help prevent this problem from occurring.  A mild laxative (prune juice, Milk of Magnesia, MiraLax, etc) should be taken according to package directions if there are no bowel movements after 48 hours.    5.   Wash / shower every day, starting 2 days after surgery.  You may shower over the steri strips which are waterproof.    6. Remove your outer bandage 2 days after surgery. Steri strips will peel off after 1-2 weeks. You may leave the incision open to air.  You may replace a dressing/Band-Aid to cover an incision for comfort if you wish.  Continue to shower over incision(s) after the dressing is off.  7. ACTIVITIES as  tolerated:   a. You may resume regular (light) daily activities beginning the next day--such as daily self-care, walking, climbing stairs--gradually increasing activities as tolerated.  Control your pain so that you can walk an hour a day.  If you can walk 30 minutes without difficulty, it is safe to try more intense activity such as jogging, treadmill, bicycling, low-impact aerobics, etc. b. Refrain from the most intensive and strenuous activity such as sit-ups, heavy lifting, contact sports, etc  Refrain from any heavy lifting or straining until 6 weeks after surgery.   c. DO NOT PUSH THROUGH PAIN.  Let pain be your guide: If it hurts to do something, don't do it.  Pain is your body warning you to avoid that activity for another week until the pain goes down. d. You may drive when you are no longer taking prescription pain medication, you can comfortably wear a seatbelt, and you can safely maneuver your car and apply brakes. e. Dennis Bast may have sexual intercourse when it is comfortable.   8. FOLLOW UP in our office a. Please call [facility] at [phone] to set up an appointment to see your surgeon in the office for a follow-up appointment approximately 2-3 weeks after your surgery. b. Make sure that you call for this appointment the day you arrive home to insure a convenient appointment time.  9.  If you have disability of FMLA / Family leave forms, please bring the forms to the office for processing.  (do not give to your surgeon).  WHEN TO CALL us [phone]: 1. Poor pain control 2. Reactions / problems with new medications (rash/itching, nausea, etc)  3. Fever over 101.5 F (38.5 C) 4. Inability to urinate 5. Nausea and/or vomiting 6. Worsening swelling or bruising 7. Continued bleeding from incision. 8. Increased pain, redness, or drainage from the incision   The clinic staff is available to answer your questions during regular business hours (8:30am-5pm).  Please don't hesitate to call and  ask to speak to one of our nurses for clinical concerns.   If you have a medical emergency, go to the nearest emergency room or call 911.  A surgeon from [facility] is always on call at the hospitals in [facility], [place], [place], [place], [place]  [address] ?  [address] [address], [place], [place]   [address] MAIN: [phone] ? TOLL FREE: [phone] ? FAX: [phone] [email or web address]       General Anesthesia, Adult, Care After This sheet gives you information about how to care for yourself after your procedure. Your health care provider may also give you more specific instructions. If you have problems or questions, contact your health care provider. What can I expect after the procedure? After the procedure, the following side effects are common:  Pain or discomfort at the IV site.  Nausea.  Vomiting.  Sore throat.  Trouble concentrating.  Feeling cold or chills.  Weak or tired.  Sleepiness and fatigue.  Soreness and body aches.  These side effects can affect parts of the body that were not involved in surgery. Follow these instructions at home:  For at least 24 hours after the procedure:  Have a responsible adult stay with you. It is important to have someone help care for you until you are awake and alert.  Rest as needed.  Do not: ? Participate in activities in which you could fall or become injured. ? Drive. ? Use heavy machinery. ? Drink alcohol. ? Take sleeping pills or medicines that cause drowsiness. ? Make important decisions or sign legal documents. ? Take care of children on your own. Eating and drinking  Follow any instructions from your health care provider about eating or drinking restrictions.  When you feel hungry, start by eating small amounts of foods that are soft and easy to digest (bland), such as toast. Gradually return to your regular diet.  Drink enough fluid to  keep your urine pale yellow.  If you vomit, rehydrate by drinking water, juice, or clear broth. General instructions  If you have sleep apnea, surgery and certain medicines can increase your risk for breathing problems. Follow instructions from your health care provider about wearing your sleep device: ? Anytime you are sleeping, including during daytime naps. ? While taking prescription pain medicines, sleeping medicines, or medicines that make you drowsy.  Return to your normal activities as told by your health care provider. Ask your health care provider what activities are safe for you.  Take over-the-counter and prescription medicines only as told by your health care provider.  If you smoke, do not smoke without supervision.  Keep all follow-up visits as told by your health care provider. This is important. Contact a health care provider if:  You have nausea or vomiting that does not get better with medicine.  You cannot eat or drink without vomiting.  You have pain that does not get better with medicine.  You are unable to pass urine.  You develop a skin rash.  You have a fever.  You have redness around your IV site that gets worse. Get help right away if:  You have difficulty breathing.  You have chest pain.  You have blood in your urine or stool, or you vomit blood. Summary  After the procedure, it is common to have a sore throat or nausea. It is also common to feel tired.  Have a responsible adult stay with you for the first 24 hours after general anesthesia. It is important to have someone help care for you until you are awake and alert.  When you feel hungry, start by eating small amounts of foods that are soft and easy to digest (bland), such as toast. Gradually return to your regular diet.  Drink enough fluid to keep your urine pale yellow.  Return to your normal activities as told by your health care provider. Ask your health care provider what activities  are safe for you. This information is not intended to replace advice given to you by your health care provider. Make sure you discuss any questions you have with your health care provider. Document Revised: 03/15/2017 Document Reviewed: 10/26/2016 Elsevier Patient Education  [employer].

## 2019-12-22 NOTE — Progress Notes (Signed)
Per Dr Kae Heller pt does not have to void prior to discharge.

## 2019-12-22 NOTE — Interval H&P Note (Signed)
History and Physical Interval Note:  12/22/2019 12:19 PM  Aaron Mcclain  has presented today for surgery, with the diagnosis of INGUINAL HERNIA.  The various methods of treatment have been discussed with the patient and family. After consideration of risks, benefits and other options for treatment, the patient has consented to  Procedure(s): OPEN LEFT INGUINAL HERNIA REPAIR WITH MESH (Left) as a surgical intervention.  The patient's history has been reviewed, patient examined, no change in status, stable for surgery.  I have reviewed the patient's chart and labs.  Questions were answered to the patient's satisfaction.     Hatley Henegar Rich Brave

## 2019-12-22 NOTE — Anesthesia Procedure Notes (Signed)
Procedure Name: LMA Insertion Performed by: West Pugh, CRNA Pre-anesthesia Checklist: Patient identified, Emergency Drugs available, Suction available, Patient being monitored and Timeout performed Patient Re-evaluated:Patient Re-evaluated prior to induction Oxygen Delivery Method: Circle system utilized Preoxygenation: Pre-oxygenation with 100% oxygen Induction Type: IV induction Ventilation: Mask ventilation without difficulty LMA: LMA inserted LMA Size: 5.0 Number of attempts: 1 Placement Confirmation: positive ETCO2 and breath sounds checked- equal and bilateral Tube secured with: Tape Dental Injury: Teeth and Oropharynx as per pre-operative assessment

## 2019-12-22 NOTE — Op Note (Signed)
Operative Note  Kamerin Grumbine  [id number]  [id number]  12/22/2019   Surgeon: Clovis Riley MD FACS   Procedure performed: Open inguinal hernia repair with UltraPro mesh: left indirect   Preop diagnosis:  left inguinal hernia   Post-op diagnosis/intraop findings: left indirect inguinal hernia   Specimens: none   EBL: 5cc   Complications: none   Description of procedure: After obtaining informed consent the patient was taken to the operating room and placed supine on operating room table where general anesthesia was initiated, preoperative antibiotics were administered, SCDs applied, and a formal timeout was performed. Foley inserted which is removed at the end of the case. The groin was clipped, prepped and draped in the usual sterile fashion. An oblique incision was made in the just above the inguinal ligament after infiltrating the tissues with local anesthetic (exparel mixed with 0.25% marcaine with epinephrine). Soft tissues were dissected using electrocautery until the external oblique aponeurosis was encountered. This was divided sharply to expand the external ring. A plane was bluntly developed between the spermatic cord and the external oblique. The ilioinguinal nerve was identified and divided between hemostats, each end ligated with 3-0 vicryl ties. The spermatic cord was then bluntly dissected away from the pubic tubercle and encircled with a Penrose. Inspection of the inguinal anatomy revealed a indirect sac, no direct defect. The indirect hernia sac was bluntly dissected away from the cord structures. Once we had affirmatively identified the sac, it was carefully opened in a region that was very thin. Inspection confirmed communication with the peritoneal cavity with no bowel contained currently. The sac was clamped and the excess excised at the level of the internal ring, where the remaining sac was ligated with a 3-0 vicryl pursetring and reduced into the abdomen.  A 3 x 6 piece  of ultra Pro mesh was brought onto the field and trimmed to approximate the field. This was sutured to the pubic tubercle fascia using 0 ethibond. Interrupted 0 ethibonds were then used to suture the mesh to the inferior shelving edge and to the internal oblique superiorly. The tails of the mesh were wrapped around the spermatic cord, ensuring adequate room for the cord, and sutured to each other with 0 ethibond, and then directed laterally to lie flat. Hemostasis was ensured within the wound. The Penrose was removed. The external oblique aponeurosis was reapproximated with a running 3-0 Vicryl to re-create a narrowed external ring. More local was infiltrated around the pubic tubercle and in the plane just below the external oblique. The Scarpa's fascia and deep subcutaneous tissue layers were reapproximated with interrupted 3-0 Vicryls. The skin was closed with a running subcuticular 4-0 Monocryl. The field was then cleaned, benzoin and Steri-Strips and sterile bandage were applied. Both testicles were palpated in the scrotum at the end of the case. The patient was then awakened extubated and taken to PACU in stable condition.    All counts were correct at the completion of the case

## 2019-12-22 NOTE — Transfer of Care (Signed)
Immediate Anesthesia Transfer of Care Note  Patient: Aaron Mcclain  Procedure(s) Performed: OPEN LEFT INGUINAL HERNIA REPAIR WITH MESH (Left Abdomen)  Patient Location: PACU  Anesthesia Type:General  Level of Consciousness: sedated  Airway & Oxygen Therapy: Patient Spontanous Breathing and Patient connected to face mask oxygen  Post-op Assessment: Report given to RN and Post -op Vital signs reviewed and stable  Post vital signs: Reviewed and stable  Last Vitals:  Vitals Value Taken Time  BP    Temp    Pulse 58 12/22/19 1435  Resp 13 12/22/19 1435  SpO2 100 % 12/22/19 1435  Vitals shown include unvalidated device data.  Last Pain:  Vitals:   12/22/19 1056  TempSrc: Oral         Complications: No complications documented.

## 2019-12-23 ENCOUNTER — Encounter (HOSPITAL_COMMUNITY): Payer: Self-pay | Admitting: Surgery
